# Patient Record
Sex: Male | Born: 1945 | Race: White | Hispanic: No | Marital: Married | Smoking: Current every day smoker
Health system: Southern US, Community
[De-identification: ages and names within clinical notes are randomized; demographics above are authoritative.]

## PROBLEM LIST (undated history)

## (undated) DIAGNOSIS — F419 Anxiety disorder, unspecified: Secondary | ICD-10-CM

## (undated) DIAGNOSIS — Z87442 Personal history of urinary calculi: Secondary | ICD-10-CM

## (undated) HISTORY — PX: APPENDECTOMY: SHX54

---

## 2009-07-15 ENCOUNTER — Ambulatory Visit (HOSPITAL_COMMUNITY): Admission: RE | Admit: 2009-07-15 | Discharge: 2009-07-15 | Payer: Self-pay | Admitting: *Deleted

## 2009-07-27 ENCOUNTER — Inpatient Hospital Stay (HOSPITAL_COMMUNITY): Admission: RE | Admit: 2009-07-27 | Discharge: 2009-07-28 | Payer: Self-pay | Admitting: *Deleted

## 2010-07-24 LAB — COMPREHENSIVE METABOLIC PANEL
ALT: 16 U/L (ref 0–53)
AST: 20 U/L (ref 0–37)
Albumin: 4.2 g/dL (ref 3.5–5.2)
BUN: 19 mg/dL (ref 6–23)
CO2: 26 mEq/L (ref 19–32)
CO2: 30 mEq/L (ref 19–32)
Chloride: 105 mEq/L (ref 96–112)
Chloride: 107 mEq/L (ref 96–112)
Creatinine, Ser: 1.22 mg/dL (ref 0.4–1.5)
GFR calc non Af Amer: >60 mL/min (ref >60)
Glucose, Bld: 104 mg/dL — ABNORMAL HIGH (ref 70–99)
Glucose, Bld: 107 mg/dL — ABNORMAL HIGH (ref 70–99)
Potassium: 3.9 mEq/L (ref 3.5–5.1)
Sodium: 141 mEq/L (ref 135–145)
Total Bilirubin: 0.6 mg/dL (ref 0.3–1.2)
Total Protein: 6.6 g/dL (ref 6.0–8.3)

## 2010-07-24 LAB — DIFFERENTIAL
Basophils Absolute: 0 10*3/uL (ref 0.0–0.1)
Basophils Absolute: 0 10*3/uL (ref 0.0–0.1)
Eosinophils Absolute: 0.2 10*3/uL (ref 0.0–0.7)
Eosinophils Absolute: 0.2 10*3/uL (ref 0.0–0.7)
Eosinophils Relative: 2 % (ref 0–5)
Monocytes Absolute: 0.3 10*3/uL (ref 0.1–1.0)
Monocytes Relative: 4 % (ref 3–12)
Neutrophils Relative %: 59 % (ref 43–77)

## 2010-07-24 LAB — URINALYSIS, ROUTINE W REFLEX MICROSCOPIC
Bilirubin Urine: NEGATIVE
Ketones, ur: NEGATIVE mg/dL
Nitrite: NEGATIVE
Protein, ur: NEGATIVE mg/dL
Urobilinogen, UA: 1 mg/dL (ref 0.0–1.0)
pH: 5 (ref 5.0–8.0)

## 2010-07-24 LAB — CBC
Hemoglobin: 17 g/dL (ref 13.0–17.0)
MCV: 96.9 fL (ref 78.0–100.0)
Platelets: 196 10*3/uL (ref 150–400)
Platelets: 239 10*3/uL (ref 150–400)
RBC: 4.84 MIL/uL (ref 4.22–5.81)
RBC: 5.1 MIL/uL (ref 4.22–5.81)
WBC: 10.6 10*3/uL — ABNORMAL HIGH (ref 4.0–10.5)

## 2010-07-24 LAB — URINE MICROSCOPIC-ADD ON

## 2010-07-24 LAB — PROTIME-INR
INR: 0.99 (ref 0.00–1.49)
Prothrombin Time: 12.7 seconds (ref 11.6–15.2)
Prothrombin Time: 13 seconds (ref 11.6–15.2)

## 2010-07-24 LAB — TYPE AND SCREEN: ABO/RH(D): O POS

## 2011-05-27 IMAGING — RF DG CERVICAL SPINE 2 OR 3 VIEWS
1 series · 2 of 2 positions shown · non-contrast
Comparison: None.

CLINICAL DATA: Neck pain with radiculopathy

CERVICAL SPINE - 2-3 VIEW intraoperative C-arm.

[Series 1: run · 2 of 2 slices shown]
[im 1/2]
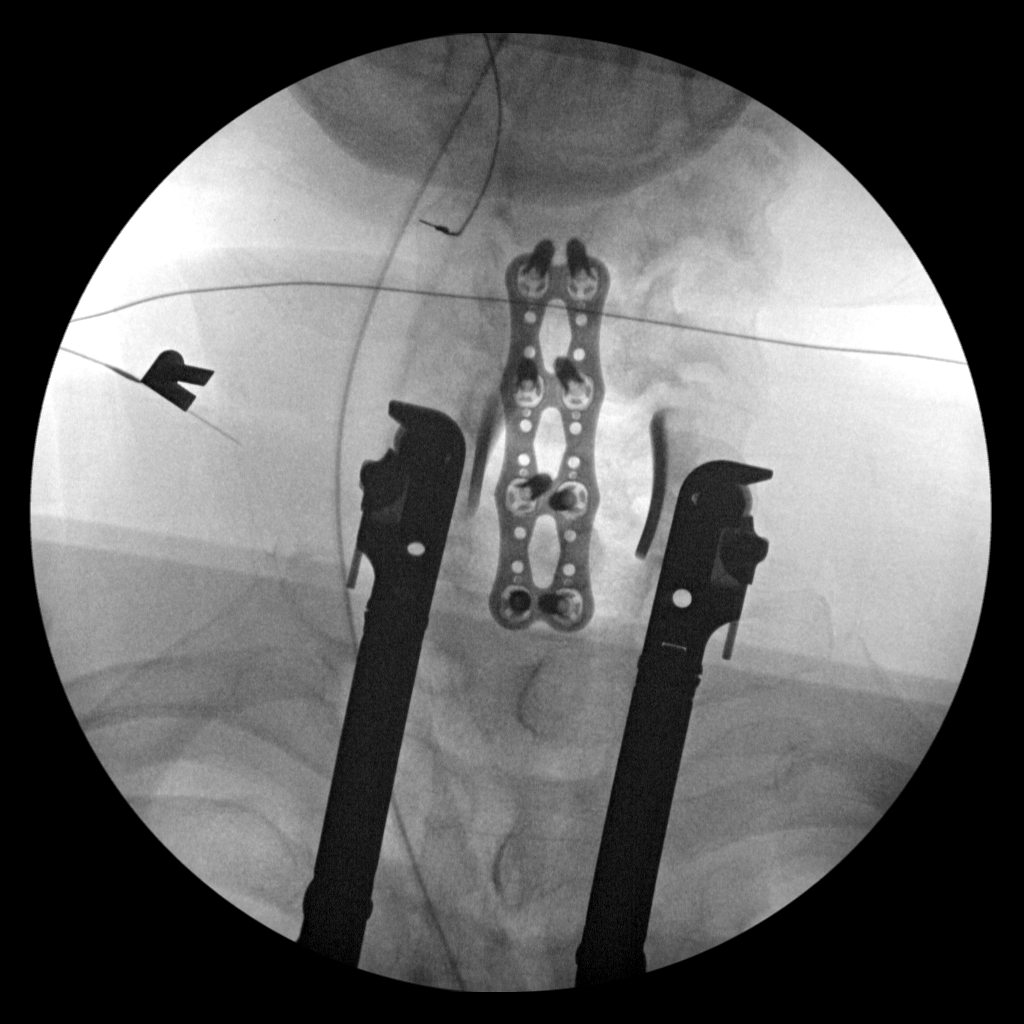
[im 2/2]
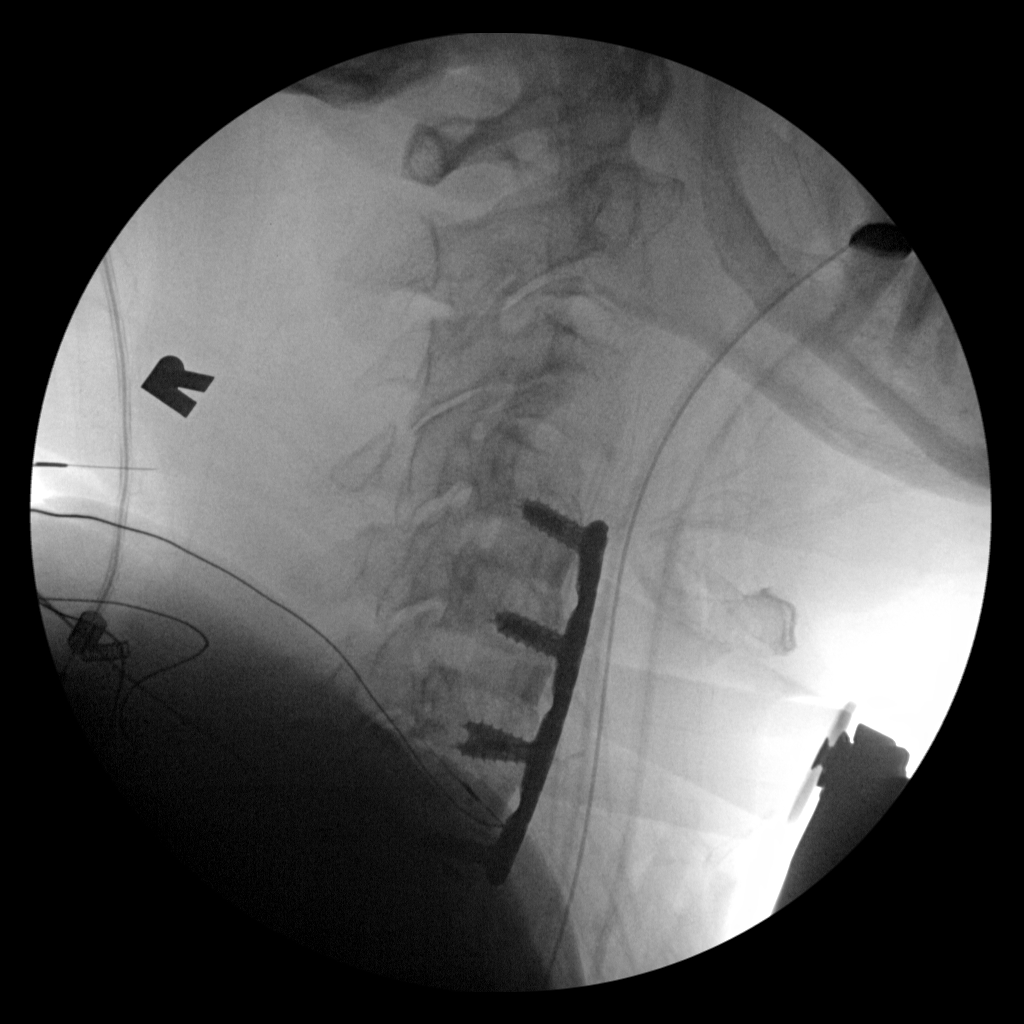

[2 of 2 positions shown; findings below may reference images not displayed]

FINDINGS: There has been an  ACDF from C4-C7.  Grossly satisfactory
position and alignment.  Recommend repeat films postoperatively to
assess placement of hardware and bone plugs.
IMPRESSION: As above.

## 2018-04-07 ENCOUNTER — Other Ambulatory Visit: Payer: Self-pay | Admitting: Orthopedic Surgery

## 2018-04-25 NOTE — Pre-Procedure Instructions (Signed)
John Ellison  04/25/2018      [facility] - [facility] - [place], [place] - [address] [place] [address] Phone: [phone] Fax: [phone]    Your procedure is scheduled on Thursday January 9th.  [facility] Admitting at 9:00 A.M.  Call this number if you have problems the morning of surgery:  [phone]   Remember:  Do not eat or drink after midnight.     Take these medicines the morning of surgery with A SIP OF WATER  clonazePAM (KLONOPIN) if needed methocarbamol (ROBAXIN) if needed traMADol (ULTRAM) if needed   7 days prior to surgery STOP taking any Aspirin(unless otherwise instructed by your surgeon), Alka-seltzer, Aleve, Naproxen, Ibuprofen, Motrin, Advil, Goody's, BC's, all herbal medications, fish oil, and all vitamins    Do not wear jewelry.  Do not wear lotions, powders, or colognes, or deodorant.  Do not shave 48 hours prior to surgery.  Men may shave face and neck.  Do not bring valuables to the hospital.  [facility] is not responsible for any belongings or valuables.  Contacts, eyeglasses, hearing aids, dentures or bridgework may not be worn into surgery.  Leave your suitcase in the car.  After surgery it may be brought to your room.  For patients admitted to the hospital, discharge time will be determined by your treatment team.  Patients discharged the day of surgery will not be allowed to drive home.   [facility]- Preparing For Surgery  Before surgery, you can play an important role. Because skin is not sterile, your skin needs to be as free of germs as possible. You can reduce the number of germs on your skin by washing with CHG (chlorahexidine gluconate) Soap before surgery.  CHG is an antiseptic cleaner which kills germs and bonds with the skin to continue killing germs even after washing.    Oral Hygiene is also important to reduce your risk of infection.  Remember - BRUSH YOUR TEETH THE  MORNING OF SURGERY WITH YOUR REGULAR TOOTHPASTE  Please do not use if you have an allergy to CHG or antibacterial soaps. If your skin becomes reddened/irritated stop using the CHG.  Do not shave (including legs and underarms) for at least 48 hours prior to first CHG shower. It is OK to shave your face.  Please follow these instructions carefully.   1. Shower the NIGHT BEFORE SURGERY and the MORNING OF SURGERY with CHG.   2. If you chose to wash your hair, wash your hair first as usual with your normal shampoo.  3. After you shampoo, rinse your hair and body thoroughly to remove the shampoo.  4. Use CHG as you would any other liquid soap. You can apply CHG directly to the skin and wash gently with a scrungie or a clean washcloth.   5. Apply the CHG Soap to your body ONLY FROM THE NECK DOWN.  Do not use on open wounds or open sores. Avoid contact with your eyes, ears, mouth and genitals (private parts). Wash Face and genitals (private parts)  with your normal soap.  6. Wash thoroughly, paying special attention to the area where your surgery will be performed.  7. Thoroughly rinse your body with warm water from the neck down.  8. DO NOT shower/wash with your normal soap after using and rinsing off the CHG Soap.  9. Pat yourself dry with a CLEAN TOWEL.  10. Wear CLEAN PAJAMAS to bed the  night before surgery, wear comfortable clothes the morning of surgery  11. Place CLEAN SHEETS on your bed the night of your first shower and DO NOT SLEEP WITH PETS.    Day of Surgery: Shower as stated above. Do not apply any deodorants/lotions.  Please wear clean clothes to the hospital/surgery center.   Remember to brush your teeth WITH YOUR REGULAR TOOTHPASTE.   Please read over the following fact sheets that you were given.

## 2018-04-28 ENCOUNTER — Other Ambulatory Visit: Payer: Self-pay

## 2018-04-28 ENCOUNTER — Ambulatory Visit (HOSPITAL_COMMUNITY)
Admission: RE | Admit: 2018-04-28 | Discharge: 2018-04-28 | Disposition: A | Discharge status: Home / Self Care | Payer: Worker's Compensation | Source: Ambulatory Visit | Attending: Orthopedic Surgery | Admitting: Orthopedic Surgery

## 2018-04-28 ENCOUNTER — Encounter (HOSPITAL_COMMUNITY): Payer: Self-pay

## 2018-04-28 ENCOUNTER — Encounter (HOSPITAL_COMMUNITY)
Admission: RE | Admit: 2018-04-28 | Discharge: 2018-04-28 | Disposition: A | Discharge status: Home / Self Care | Payer: Worker's Compensation | Source: Ambulatory Visit | Attending: Orthopedic Surgery | Admitting: Orthopedic Surgery

## 2018-04-28 DIAGNOSIS — Z01811 Encounter for preprocedural respiratory examination: Secondary | ICD-10-CM | POA: Diagnosis present

## 2018-04-28 HISTORY — DX: Personal history of urinary calculi: Z87.442

## 2018-04-28 HISTORY — DX: Anxiety disorder, unspecified: F41.9

## 2018-04-28 LAB — CBC WITH DIFFERENTIAL/PLATELET
Abs Immature Granulocytes: 0.02 10*3/uL (ref 0.00–0.07)
Basophils Absolute: 0 10*3/uL (ref 0.0–0.1)
Basophils Relative: 1 %
Eosinophils Absolute: 0.1 10*3/uL (ref 0.0–0.5)
Eosinophils Relative: 1 %
HEMATOCRIT: 51.9 % (ref 39.0–52.0)
HEMOGLOBIN: 16.7 g/dL (ref 13.0–17.0)
Immature Granulocytes: 0 %
LYMPHS ABS: 1.8 10*3/uL (ref 0.7–4.0)
Lymphocytes Relative: 25 %
MCH: 30.9 pg (ref 26.0–34.0)
MCHC: 32.2 g/dL (ref 30.0–36.0)
MCV: 96.1 fL (ref 80.0–100.0)
MONOS PCT: 6 %
Monocytes Absolute: 0.5 10*3/uL (ref 0.1–1.0)
Neutro Abs: 4.8 10*3/uL (ref 1.7–7.7)
Neutrophils Relative %: 67 %
Platelets: 212 10*3/uL (ref 150–400)
RBC: 5.4 MIL/uL (ref 4.22–5.81)
RDW: 12.8 % (ref 11.5–15.5)
WBC: 7.2 10*3/uL (ref 4.0–10.5)
nRBC: 0 % (ref 0.0–0.2)

## 2018-04-28 LAB — URINALYSIS, ROUTINE W REFLEX MICROSCOPIC
Bilirubin Urine: NEGATIVE
Glucose, UA: NEGATIVE mg/dL
Hgb urine dipstick: NEGATIVE
Ketones, ur: NEGATIVE mg/dL
LEUKOCYTES UA: NEGATIVE
Nitrite: NEGATIVE
Protein, ur: NEGATIVE mg/dL
Specific Gravity, Urine: 1.02 (ref 1.005–1.030)
pH: 5 (ref 5.0–8.0)

## 2018-04-28 LAB — COMPREHENSIVE METABOLIC PANEL
ALK PHOS: 55 U/L (ref 38–126)
ALT: 21 U/L (ref 0–44)
AST: 22 U/L (ref 15–41)
Albumin: 4 g/dL (ref 3.5–5.0)
Anion gap: 11 (ref 5–15)
BUN: 22 mg/dL (ref 8–23)
CALCIUM: 9.1 mg/dL (ref 8.9–10.3)
CO2: 22 mmol/L (ref 22–32)
Chloride: 108 mmol/L (ref 98–111)
Creatinine, Ser: 1.39 mg/dL — ABNORMAL HIGH (ref 0.61–1.24)
GFR calc Af Amer: 59 mL/min — ABNORMAL LOW (ref >60)
GFR calc non Af Amer: 51 mL/min — ABNORMAL LOW (ref >60)
Glucose, Bld: 118 mg/dL — ABNORMAL HIGH (ref 70–99)
Potassium: 3.6 mmol/L (ref 3.5–5.1)
Sodium: 141 mmol/L (ref 135–145)
Total Bilirubin: 0.6 mg/dL (ref 0.3–1.2)
Total Protein: 6.8 g/dL (ref 6.5–8.1)

## 2018-04-28 LAB — PROTIME-INR
INR: 1.06
Prothrombin Time: 13.7 seconds (ref 11.4–15.2)

## 2018-04-28 LAB — SURGICAL PCR SCREEN
MRSA, PCR: NEGATIVE
Staphylococcus aureus: NEGATIVE

## 2018-04-28 LAB — TYPE AND SCREEN
ABO/RH(D): O POS
Antibody Screen: NEGATIVE

## 2018-04-28 LAB — APTT: aPTT: 35 seconds (ref 24–36)

## 2018-04-28 NOTE — Progress Notes (Signed)
PCP - Dr. Noland FordyceStephen Erlandson Cardiologist - denies  Chest x-ray - 04/28/18 EKG - 04/28/18 Stress Test - 10+ years ago ECHO - denies Cardiac Cath - denies  Sleep Study - denies  Aspirin Instructions: N/A; instructed not to take any alka-seltzer 7 days prior to surgery date.   Anesthesia review: No  Patient denies shortness of breath, fever, cough and chest pain at PAT appointment   Patient verbalized understanding of instructions that were given to them at the PAT appointment. Patient was also instructed that they will need to review over the PAT instructions again at home before surgery.

## 2018-05-07 MED ORDER — TRANEXAMIC ACID-NACL 1000-0.7 MG/100ML-% IV SOLN
1000.0000 mg | INTRAVENOUS | Status: AC
  Administered 2018-05-08: 1000 mg via INTRAVENOUS

## 2018-05-08 ENCOUNTER — Encounter (HOSPITAL_COMMUNITY): Payer: Self-pay

## 2018-05-08 ENCOUNTER — Inpatient Hospital Stay (HOSPITAL_COMMUNITY)
Admission: RE | Admit: 2018-05-08 | Discharge: 2018-05-09 | DRG: 483 | Disposition: A | Discharge status: Home / Self Care | Payer: Worker's Compensation | Attending: Orthopedic Surgery | Admitting: Orthopedic Surgery

## 2018-05-08 ENCOUNTER — Inpatient Hospital Stay (HOSPITAL_COMMUNITY): Payer: Worker's Compensation | Admitting: Certified Registered Nurse Anesthetist

## 2018-05-08 ENCOUNTER — Encounter (HOSPITAL_COMMUNITY)
Admission: RE | Disposition: A | Discharge status: Home / Self Care | Payer: Self-pay | Source: Home / Self Care | Attending: Orthopedic Surgery

## 2018-05-08 ENCOUNTER — Inpatient Hospital Stay (HOSPITAL_COMMUNITY): Payer: Worker's Compensation

## 2018-05-08 ENCOUNTER — Other Ambulatory Visit: Payer: Self-pay

## 2018-05-08 DIAGNOSIS — M75101 Unspecified rotator cuff tear or rupture of right shoulder, not specified as traumatic: Secondary | ICD-10-CM | POA: Diagnosis present

## 2018-05-08 DIAGNOSIS — Z79899 Other long term (current) drug therapy: Secondary | ICD-10-CM

## 2018-05-08 DIAGNOSIS — F419 Anxiety disorder, unspecified: Secondary | ICD-10-CM | POA: Diagnosis present

## 2018-05-08 DIAGNOSIS — M19011 Primary osteoarthritis, right shoulder: Secondary | ICD-10-CM | POA: Diagnosis present

## 2018-05-08 DIAGNOSIS — Z96611 Presence of right artificial shoulder joint: Secondary | ICD-10-CM

## 2018-05-08 DIAGNOSIS — F17210 Nicotine dependence, cigarettes, uncomplicated: Secondary | ICD-10-CM | POA: Diagnosis present

## 2018-05-08 DIAGNOSIS — Z9889 Other specified postprocedural states: Secondary | ICD-10-CM

## 2018-05-08 HISTORY — PX: REVERSE SHOULDER ARTHROPLASTY: SHX5054

## 2018-05-08 SURGERY — ARTHROPLASTY, SHOULDER, TOTAL, REVERSE
Anesthesia: General | Laterality: Right

## 2018-05-08 MED ORDER — MIDAZOLAM HCL 2 MG/2ML IJ SOLN
INTRAMUSCULAR | Status: AC
  Administered 2018-05-08: 2 mg via INTRAVENOUS
  Filled 2018-05-08: qty 2

## 2018-05-08 MED ORDER — MENTHOL 3 MG MT LOZG: 1.0000 | LOZENGE | OROMUCOSAL | Status: DC | PRN

## 2018-05-08 MED ORDER — METOCLOPRAMIDE HCL 5 MG PO TABS: 5.0000 mg | ORAL_TABLET | Freq: Three times a day (TID) | ORAL | Status: DC | PRN

## 2018-05-08 MED ORDER — OXYCODONE HCL 5 MG/5ML PO SOLN: 5.0000 mg | Freq: Once | ORAL | Status: DC | PRN

## 2018-05-08 MED ORDER — ONDANSETRON HCL 4 MG/2ML IJ SOLN: 4.0000 mg | Freq: Four times a day (QID) | INTRAMUSCULAR | Status: DC | PRN

## 2018-05-08 MED ORDER — ASPIRIN EC 325 MG PO TBEC
325.0000 mg | DELAYED_RELEASE_TABLET | Freq: Every day | ORAL | Status: DC
  Administered 2018-05-09: 325 mg via ORAL
  Filled 2018-05-08: qty 1

## 2018-05-08 MED ORDER — ASPIRIN 325 MG PO TBEC: 325.0000 mg | DELAYED_RELEASE_TABLET | Freq: Every day | ORAL | 0 refills | Status: AC

## 2018-05-08 MED ORDER — OXYCODONE HCL 5 MG PO TABS: 5.0000 mg | ORAL_TABLET | ORAL | Status: DC | PRN

## 2018-05-08 MED ORDER — CHLORHEXIDINE GLUCONATE 4 % EX LIQD: 60.0000 mL | Freq: Once | CUTANEOUS | Status: DC

## 2018-05-08 MED ORDER — SUGAMMADEX SODIUM 200 MG/2ML IV SOLN
INTRAVENOUS | Status: DC | PRN
  Administered 2018-05-08: 100 mg via INTRAVENOUS

## 2018-05-08 MED ORDER — ROCURONIUM BROMIDE 10 MG/ML (PF) SYRINGE
PREFILLED_SYRINGE | INTRAVENOUS | Status: DC | PRN
  Administered 2018-05-08: 50 mg via INTRAVENOUS

## 2018-05-08 MED ORDER — TRANEXAMIC ACID-NACL 1000-0.7 MG/100ML-% IV SOLN
INTRAVENOUS | Status: AC
  Filled 2018-05-08: qty 100

## 2018-05-08 MED ORDER — FENTANYL CITRATE (PF) 100 MCG/2ML IJ SOLN
INTRAMUSCULAR | Status: AC
  Administered 2018-05-08: 50 ug via INTRAVENOUS
  Filled 2018-05-08: qty 2

## 2018-05-08 MED ORDER — CLONAZEPAM 1 MG PO TABS
2.0000 mg | ORAL_TABLET | Freq: Two times a day (BID) | ORAL | Status: DC
  Administered 2018-05-08: 2 mg via ORAL
  Filled 2018-05-08 (×2): qty 2

## 2018-05-08 MED ORDER — PROPOFOL 10 MG/ML IV BOLUS
INTRAVENOUS | Status: DC | PRN
Start: 2018-05-08 — End: 2018-05-08
  Administered 2018-05-08: 200 mg via INTRAVENOUS

## 2018-05-08 MED ORDER — OXYCODONE HCL 5 MG PO TABS: 10.0000 mg | ORAL_TABLET | ORAL | Status: DC | PRN

## 2018-05-08 MED ORDER — METHOCARBAMOL 500 MG PO TABS: 500.0000 mg | ORAL_TABLET | Freq: Four times a day (QID) | ORAL | Status: DC | PRN

## 2018-05-08 MED ORDER — FENTANYL CITRATE (PF) 100 MCG/2ML IJ SOLN
50.0000 ug | Freq: Once | INTRAMUSCULAR | Status: AC
  Administered 2018-05-08: 50 ug via INTRAVENOUS

## 2018-05-08 MED ORDER — LACTATED RINGERS IV SOLN
INTRAVENOUS | Status: DC
  Administered 2018-05-08: 10:00:00 via INTRAVENOUS

## 2018-05-08 MED ORDER — CEFAZOLIN SODIUM-DEXTROSE 1-4 GM/50ML-% IV SOLN
1.0000 g | Freq: Four times a day (QID) | INTRAVENOUS | Status: AC
  Administered 2018-05-08 – 2018-05-09 (×3): 1 g via INTRAVENOUS
  Filled 2018-05-08 (×3): qty 50

## 2018-05-08 MED ORDER — SODIUM CHLORIDE 0.9 % IV SOLN
INTRAVENOUS | Status: DC
  Administered 2018-05-08: 14:00:00 via INTRAVENOUS

## 2018-05-08 MED ORDER — DEXAMETHASONE SODIUM PHOSPHATE 10 MG/ML IJ SOLN
INTRAMUSCULAR | Status: DC | PRN
  Administered 2018-05-08: 10 mg via INTRAVENOUS

## 2018-05-08 MED ORDER — DOCUSATE SODIUM 100 MG PO CAPS
100.0000 mg | ORAL_CAPSULE | Freq: Two times a day (BID) | ORAL | Status: DC
  Administered 2018-05-08 (×2): 100 mg via ORAL
  Filled 2018-05-08 (×3): qty 1

## 2018-05-08 MED ORDER — FENTANYL CITRATE (PF) 100 MCG/2ML IJ SOLN: 25.0000 ug | INTRAMUSCULAR | Status: DC | PRN

## 2018-05-08 MED ORDER — ACETAMINOPHEN 325 MG PO TABS: 325.0000 mg | ORAL_TABLET | Freq: Four times a day (QID) | ORAL | Status: DC | PRN

## 2018-05-08 MED ORDER — PHENOL 1.4 % MT LIQD: 1.0000 | OROMUCOSAL | Status: DC | PRN

## 2018-05-08 MED ORDER — ONDANSETRON HCL 4 MG/2ML IJ SOLN
INTRAMUSCULAR | Status: DC | PRN
Start: 2018-05-08 — End: 2018-05-08
  Administered 2018-05-08: 4 mg via INTRAVENOUS

## 2018-05-08 MED ORDER — ONDANSETRON HCL 4 MG PO TABS: 4.0000 mg | ORAL_TABLET | Freq: Four times a day (QID) | ORAL | Status: DC | PRN

## 2018-05-08 MED ORDER — METHOCARBAMOL 1000 MG/10ML IJ SOLN
500.0000 mg | Freq: Four times a day (QID) | INTRAVENOUS | Status: DC | PRN
  Filled 2018-05-08: qty 5

## 2018-05-08 MED ORDER — OXYCODONE HCL 5 MG PO TABS: 5.0000 mg | ORAL_TABLET | Freq: Once | ORAL | Status: DC | PRN

## 2018-05-08 MED ORDER — SODIUM CHLORIDE 0.9 % IV SOLN
INTRAVENOUS | Status: DC | PRN
  Administered 2018-05-08: 45 ug/min via INTRAVENOUS

## 2018-05-08 MED ORDER — PROPOFOL 10 MG/ML IV BOLUS
INTRAVENOUS | Status: AC
  Filled 2018-05-08: qty 20

## 2018-05-08 MED ORDER — SODIUM CHLORIDE 0.9 % IR SOLN
Status: DC | PRN
  Administered 2018-05-08: 3000 mL

## 2018-05-08 MED ORDER — 0.9 % SODIUM CHLORIDE (POUR BTL) OPTIME
TOPICAL | Status: DC | PRN
  Administered 2018-05-08: 1000 mL

## 2018-05-08 MED ORDER — LIDOCAINE 2% (20 MG/ML) 5 ML SYRINGE
INTRAMUSCULAR | Status: DC | PRN
  Administered 2018-05-08: 100 mg via INTRAVENOUS

## 2018-05-08 MED ORDER — MIDAZOLAM HCL 2 MG/2ML IJ SOLN: 2.0000 mg | Freq: Once | INTRAMUSCULAR | Status: DC

## 2018-05-08 MED ORDER — METOCLOPRAMIDE HCL 5 MG/ML IJ SOLN: 5.0000 mg | Freq: Three times a day (TID) | INTRAMUSCULAR | Status: DC | PRN

## 2018-05-08 MED ORDER — BISACODYL 5 MG PO TBEC: 5.0000 mg | DELAYED_RELEASE_TABLET | Freq: Every day | ORAL | Status: DC | PRN

## 2018-05-08 MED ORDER — ONDANSETRON HCL 4 MG/2ML IJ SOLN
4.0000 mg | Freq: Once | INTRAMUSCULAR | Status: DC | PRN
Start: 2018-05-08 — End: 2018-05-08

## 2018-05-08 MED ORDER — OXYCODONE-ACETAMINOPHEN 5-325 MG PO TABS: 1.0000 | ORAL_TABLET | Freq: Four times a day (QID) | ORAL | 0 refills | Status: AC | PRN

## 2018-05-08 MED ORDER — FENTANYL CITRATE (PF) 250 MCG/5ML IJ SOLN
INTRAMUSCULAR | Status: AC
  Filled 2018-05-08: qty 5

## 2018-05-08 MED ORDER — GLYCOPYRROLATE 0.2 MG/ML IJ SOLN
INTRAMUSCULAR | Status: DC | PRN
  Administered 2018-05-08: .1 mg via INTRAVENOUS
  Administered 2018-05-08: 0.1 mg via INTRAVENOUS

## 2018-05-08 MED ORDER — HYDROMORPHONE HCL 1 MG/ML IJ SOLN
0.5000 mg | INTRAMUSCULAR | Status: DC | PRN
  Filled 2018-05-08: qty 1

## 2018-05-08 MED ORDER — ACETAMINOPHEN 500 MG PO TABS
1000.0000 mg | ORAL_TABLET | Freq: Four times a day (QID) | ORAL | Status: AC
  Administered 2018-05-08 – 2018-05-09 (×4): 1000 mg via ORAL
  Filled 2018-05-08 (×4): qty 2

## 2018-05-08 MED ORDER — DIPHENHYDRAMINE HCL 12.5 MG/5ML PO ELIX: 12.5000 mg | ORAL_SOLUTION | ORAL | Status: DC | PRN

## 2018-05-08 MED ORDER — CEFAZOLIN SODIUM-DEXTROSE 2-4 GM/100ML-% IV SOLN
2.0000 g | INTRAVENOUS | Status: AC
  Administered 2018-05-08: 2 g via INTRAVENOUS
  Filled 2018-05-08: qty 100

## 2018-05-08 MED ORDER — ALUM & MAG HYDROXIDE-SIMETH 200-200-20 MG/5ML PO SUSP: 30.0000 mL | ORAL | Status: DC | PRN

## 2018-05-08 MED ORDER — BUPIVACAINE LIPOSOME 1.3 % IJ SUSP
INTRAMUSCULAR | Status: DC | PRN
  Administered 2018-05-08: 10 mL

## 2018-05-08 MED ORDER — BUPIVACAINE HCL (PF) 0.5 % IJ SOLN
INTRAMUSCULAR | Status: DC | PRN
Start: 2018-05-08 — End: 2018-05-08
  Administered 2018-05-08: 15 mL via PERINEURAL

## 2018-05-08 MED ORDER — SENNOSIDES-DOCUSATE SODIUM 8.6-50 MG PO TABS: 1.0000 | ORAL_TABLET | Freq: Every evening | ORAL | Status: DC | PRN

## 2018-05-08 SURGICAL SUPPLY — 73 items
BASEPLATE P2 COATD GLND 6.5X30 (Shoulder) IMPLANT
BIT DRILL 2.5 DIA 127 CALI (BIT) ×2 IMPLANT
BIT DRILL 4 DIA CALIBRATED (BIT) ×2 IMPLANT
BIT DRILL 5/64X5 DISP (BIT) ×2 IMPLANT
BLADE SAW SAG 73X25 THK (BLADE) ×2
BLADE SAW SGTL 73X25 THK (BLADE) ×1 IMPLANT
BLADE SURG 15 STRL LF DISP TIS (BLADE) IMPLANT
BLADE SURG 15 STRL SS (BLADE)
BOWL SMART MIX CTS (DISPOSABLE) IMPLANT
CHLORAPREP W/TINT 26ML (MISCELLANEOUS) ×3 IMPLANT
CLOSURE WOUND 1/2 X4 (GAUZE/BANDAGES/DRESSINGS) ×1
COVER SURGICAL LIGHT HANDLE (MISCELLANEOUS) ×3 IMPLANT
COVER WAND RF STERILE (DRAPES) ×3 IMPLANT
DRAPE INCISE IOBAN 66X45 STRL (DRAPES) ×3 IMPLANT
DRAPE ORTHO SPLIT 77X108 STRL (DRAPES) ×4
DRAPE SURG ORHT 6 SPLT 77X108 (DRAPES) ×2 IMPLANT
DRSG AQUACEL AG ADV 3.5X 6 (GAUZE/BANDAGES/DRESSINGS) ×3 IMPLANT
ELECT BLADE 4.0 EZ CLEAN MEGAD (MISCELLANEOUS) ×3
ELECT REM PT RETURN 9FT ADLT (ELECTROSURGICAL) ×3
ELECTRODE BLDE 4.0 EZ CLN MEGD (MISCELLANEOUS) ×1 IMPLANT
ELECTRODE REM PT RTRN 9FT ADLT (ELECTROSURGICAL) ×1 IMPLANT
GLOVE BIO SURGEON STRL SZ7 (GLOVE) ×3 IMPLANT
GLOVE BIO SURGEON STRL SZ7.5 (GLOVE) ×3 IMPLANT
GLOVE BIOGEL PI IND STRL 7.0 (GLOVE) ×1 IMPLANT
GLOVE BIOGEL PI IND STRL 8 (GLOVE) ×1 IMPLANT
GLOVE BIOGEL PI INDICATOR 7.0 (GLOVE) ×2
GLOVE BIOGEL PI INDICATOR 8 (GLOVE) ×2
GOWN STRL REUS W/ TWL LRG LVL3 (GOWN DISPOSABLE) ×3 IMPLANT
GOWN STRL REUS W/ TWL XL LVL3 (GOWN DISPOSABLE) ×1 IMPLANT
GOWN STRL REUS W/TWL LRG LVL3 (GOWN DISPOSABLE) ×6
GOWN STRL REUS W/TWL XL LVL3 (GOWN DISPOSABLE) ×2
HANDPIECE INTERPULSE COAX TIP (DISPOSABLE) ×2
HOOD PEEL AWAY FLYTE STAYCOOL (MISCELLANEOUS) ×6 IMPLANT
INSERT EPOLY STND HUMERUS 32MM (Shoulder) ×3 IMPLANT
INSERT EPOLYSTD HUMERUS 32MM (Shoulder) IMPLANT
KIT BASIN OR (CUSTOM PROCEDURE TRAY) ×3 IMPLANT
KIT TURNOVER KIT B (KITS) ×3 IMPLANT
MANIFOLD NEPTUNE II (INSTRUMENTS) ×3 IMPLANT
NDL MAYO TROCAR (NEEDLE) IMPLANT
NEEDLE MAYO TROCAR (NEEDLE) ×3 IMPLANT
NS IRRIG 1000ML POUR BTL (IV SOLUTION) ×3 IMPLANT
P2 COATDE GLNOID BSEPLT 6.5X30 (Shoulder) ×3 IMPLANT
PACK SHOULDER (CUSTOM PROCEDURE TRAY) ×3 IMPLANT
PAD ARMBOARD 7.5X6 YLW CONV (MISCELLANEOUS) ×5 IMPLANT
RESTRAINT HEAD UNIVERSAL NS (MISCELLANEOUS) ×3 IMPLANT
SCREW BONE LOCKING RSP 5.0X34 (Screw) ×6 IMPLANT
SCREW BONE RSP LOCK 5X18 (Screw) IMPLANT
SCREW BONE RSP LOCK 5X22 (Screw) IMPLANT
SCREW BONE RSP LOCK 5X34 (Screw) IMPLANT
SCREW BONE RSP LOCKING 18MM LG (Screw) ×3 IMPLANT
SCREW BONE RSP LOCKING 5.0X32 (Screw) ×3 IMPLANT
SCREW RETAIN W/HEAD 32MM (Shoulder) ×2 IMPLANT
SET HNDPC FAN SPRY TIP SCT (DISPOSABLE) ×1 IMPLANT
SLING ARM FOAM STRAP LRG (SOFTGOODS) ×3 IMPLANT
SLING ARM FOAM STRAP MED (SOFTGOODS) IMPLANT
SPONGE LAP 18X18 X RAY DECT (DISPOSABLE) IMPLANT
SPONGE LAP 4X18 RFD (DISPOSABLE) IMPLANT
STEM REVERSE ALTIVATE 8MM STD (Shoulder) ×2 IMPLANT
STRIP CLOSURE SKIN 1/2X4 (GAUZE/BANDAGES/DRESSINGS) ×2 IMPLANT
SUCTION FRAZIER HANDLE 10FR (MISCELLANEOUS) ×2
SUCTION TUBE FRAZIER 10FR DISP (MISCELLANEOUS) ×1 IMPLANT
SUPPORT WRAP ARM LG (MISCELLANEOUS) ×3 IMPLANT
SUT ETHIBOND NAB CT1 #1 30IN (SUTURE) ×3 IMPLANT
SUT FIBERWIRE #2 38 T-5 BLUE (SUTURE) ×3
SUT MNCRL AB 4-0 PS2 18 (SUTURE) ×3 IMPLANT
SUT SILK 2 0 TIES 17X18 (SUTURE)
SUT SILK 2-0 18XBRD TIE BLK (SUTURE) IMPLANT
SUT VIC AB 2-0 CT1 27 (SUTURE) ×4
SUT VIC AB 2-0 CT1 TAPERPNT 27 (SUTURE) ×2 IMPLANT
SUTURE FIBERWR #2 38 T-5 BLUE (SUTURE) IMPLANT
TOWEL OR 17X26 10 PK STRL BLUE (TOWEL DISPOSABLE) ×3 IMPLANT
WATER STERILE IRR 1000ML POUR (IV SOLUTION) ×3 IMPLANT
YANKAUER SUCT BULB TIP NO VENT (SUCTIONS) IMPLANT

## 2018-05-08 NOTE — Op Note (Signed)
Procedure(s): REVERSE SHOULDER ARTHROPLASTY Procedure Note  John Ellison male 73 y.o. 05/08/2018  Preoperative diagnosis: Right shoulder rotator cuff tear arthropathy  Postoperative diagnosis: Same  Procedure(s) and Anesthesia Type: Right shoulder REVERSE SHOULDER ARTHROPLASTY - General   Indications:  73 y.o. 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 Pain and dysfunction interfered with quality of life and nonoperative treatment with activity modification, NSAIDS and injections failed.     Surgeon: Berline Lopes   Assistants: April Green, RNFA  Anesthesia: General endotracheal anesthesia with preoperative interscalene block given by attending anesthesiologist    Procedure Detail  REVERSE SHOULDER ARTHROPLASTY   Estimated Blood Loss:  200 mL         Drains: none  Blood Given: none          Specimens: none        Complications:  * No complications entered in OR log *         Disposition: PACU - hemodynamically stable.         Condition: stable      OPERATIVE FINDINGS:  A DJO Altivate pressfit reverse total shoulder arthroplasty was placed with a  size 8 stem, a 32 standard glenosphere, and a standard-mm poly insert. The base plate  fixation was excellent.  PROCEDURE: The patient was identified in the preoperative holding area  where I personally marked the operative site after verifying site, side,  and procedure with the patient. An interscalene block given by  the attending anesthesiologist in the holding area and the patient was taken back to the operating room where all extremities were  carefully padded in position after general anesthesia was induced. She  was placed in a beach-chair position and the operative upper extremity was  prepped and draped in a standard sterile fashion. The patient received 1 g IV tranexamic acid at the start of the case around time of the incision. An approximately 10-  cm  incision was made from the tip of the coracoid process to the center  point of the humerus at the level of the axilla. Dissection was carried  down through subcutaneous tissues to the level of the cephalic vein  which was taken laterally with the deltoid. The pectoralis major was  retracted medially. The subdeltoid space was developed and the lateral  edge of the conjoined tendon was identified. The undersurface of  conjoined tendon was palpated and the musculocutaneous nerve was not in  the field. Retractor was placed underneath the conjoined and second  retractor was placed lateral into the deltoid. The circumflex humeral  artery and vessels were identified and clamped and coagulated. The  biceps tendon was absent.  The subscapularis was taken down as a peel with the underlying capsule.  The  joint was then gently externally rotated while the capsule was released  from the humeral neck around to just beyond the 6 o'clock position. At  this point, the joint was dislocated and the humeral head was presented  into the wound. The excessive osteophyte formation was removed with a  large rongeur.  The cutting guide was used to make the appropriate  head cut and the head was saved for potentially bone grafting.  The glenoid was exposed with the arm in an  abducted extended position. The anterior and posterior labrum were  completely excised and the capsule was released circumferentially to  allow for exposure of the glenoid for preparation. The 2.5 mm drill was  placed using the  guide in 5-10 inferior angulation and the tap was then advanced in the same hole. Small and large reamers were then used. The tap was then removed and the Metaglene was then screwed in with excellent purchase.  The peripheral guide was then used to drilled measured and filled peripheral locking screws. The size 32-4 glenosphere was then impacted on the [facility] taper and the central screw was placed. The humerus was then again  exposed and the diaphyseal reamers were used followed by the metaphyseal reamers. The final broach was left in place in the proximal trial was placed. The joint was reduced and with this implant it was felt that soft tissue tensioning was appropriate with excellent stability and excellent range of motion. Therefore, final humeral stem was placed press-fit with bone grafting.  And then the trial polyethylene inserts were tested again and the above implant was felt to be the most appropriate for final insertion. The joint was reduced taken through full range of motion and felt to be stable. Soft tissue tension was appropriate.  The joint was then copiously irrigated with pulse  lavage and the wound was then closed. The subscapularis was repaired with labral tapes placed around the implant through bone tunnels.  Skin was closed with 2-0 Vicryl in a deep dermal layer and 4-0  Monocryl for skin closure. Steri-Strips were applied. Sterile  dressings were then applied as well as a sling. The patient was allowed  to awaken from general anesthesia, transferred to stretcher, and taken  to recovery room in stable condition.   POSTOPERATIVE PLAN: The patient will be kept in the hospital postoperatively  for pain control and therapy.

## 2018-05-08 NOTE — Anesthesia Procedure Notes (Signed)
Procedure Name: Intubation Date/Time: 05/08/2018 10:24 AM Performed by: Julieta Bellini, CRNA Pre-anesthesia Checklist: Patient identified, Emergency Drugs available, Suction available and Patient being monitored Patient Re-evaluated:Patient Re-evaluated prior to induction Oxygen Delivery Method: Circle system utilized Preoxygenation: Pre-oxygenation with 100% oxygen Induction Type: IV induction Ventilation: Mask ventilation without difficulty Laryngoscope Size: Mac and 4 Grade View: Grade I Tube type: Oral Tube size: 7.5 mm Number of attempts: 1 Airway Equipment and Method: Stylet Placement Confirmation: ETT inserted through vocal cords under direct vision,  positive ETCO2 and breath sounds checked- equal and bilateral Secured at: 22 cm Tube secured with: Tape Dental Injury: Teeth and Oropharynx as per pre-operative assessment

## 2018-05-08 NOTE — Anesthesia Procedure Notes (Signed)
Anesthesia Regional Block: Interscalene brachial plexus block   Pre-Anesthetic Checklist: ,, timeout performed, Correct Patient, Correct Site, Correct Laterality, Correct Procedure, Correct Position, site marked, Risks and benefits discussed,  Surgical consent,  Pre-op evaluation,  At surgeon's request and post-op pain management  Laterality: Right  Prep: chloraprep       Needles:  Injection technique: Single-shot  Needle Type: Echogenic Stimulator Needle     Needle Length: 9cm  Needle Gauge: 21     Additional Needles:   Procedures:,,,, ultrasound used (permanent image in chart),,,,  Narrative:  Start time: 05/08/2018 9:30 AM End time: 05/08/2018 9:36 AM Injection made incrementally with aspirations every 5 mL.  Performed by: Personally  Anesthesiologist: Lucretia Kern, MD  Additional Notes: Monitors applied. Injection made in 5cc increments. No resistance to injection. Good needle visualization. Patient tolerated procedure well.

## 2018-05-08 NOTE — Anesthesia Postprocedure Evaluation (Signed)
Anesthesia Post Note  Patient: John Ellison  Procedure(s) Performed: REVERSE SHOULDER ARTHROPLASTY (Right )     Patient location during evaluation: PACU Anesthesia Type: General Level of consciousness: awake and alert Pain management: pain level controlled Vital Signs Assessment: post-procedure vital signs reviewed and stable Respiratory status: spontaneous breathing, nonlabored ventilation and respiratory function stable Cardiovascular status: blood pressure returned to baseline and stable Postop Assessment: no apparent nausea or vomiting Anesthetic complications: no    Last Vitals:  Vitals:   05/08/18 1257 05/08/18 1300  BP: 134/67 129/74  Pulse: (!) 56 (!) 54  Resp: 15 16  Temp: 36.6 C   SpO2: 95% 99%    Last Pain:  Vitals:   05/08/18 1257  TempSrc:   PainSc: 0-No pain                 Lucretia Kern

## 2018-05-08 NOTE — Transfer of Care (Signed)
Immediate Anesthesia Transfer of Care Note  Patient: John Ellison  Procedure(s) Performed: REVERSE SHOULDER ARTHROPLASTY (Right )  Patient Location: PACU  Anesthesia Type:General and GA combined with regional for post-op pain  Level of Consciousness: drowsy and patient cooperative  Airway & Oxygen Therapy: Patient Spontanous Breathing  Post-op Assessment: Report given to RN, Post -op Vital signs reviewed and stable and Patient moving all extremities X 4  Post vital signs: Reviewed and stable  Last Vitals:  Vitals Value Taken Time  BP 139/77 05/08/2018 12:00 PM  Temp    Pulse 62 05/08/2018 12:02 PM  Resp 16 05/08/2018 12:02 PM  SpO2 96 % 05/08/2018 12:02 PM  Vitals shown include unvalidated device data.  Last Pain:  Vitals:   05/08/18 0917  TempSrc:   PainSc: 2       Patients Stated Pain Goal: 2 (05/08/18 3810)  Complications: No apparent anesthesia complications

## 2018-05-08 NOTE — Discharge Instructions (Signed)
Discharge Instructions after Reverse Total Shoulder Arthroplasty ° ° °A sling has been provided for you. You are to where this at all times, even while sleeping, until your first post operative visit with Dr. Amoni Morales. °Use ice on the shoulder intermittently over the first 48 hours after surgery.  °Pain medicine has been prescribed for you.  °Use your medicine liberally over the first 48 hours, and then you can begin to taper your use. You may take Extra Strength Tylenol or Tylenol only in place of the pain pills. DO NOT take ANY nonsteroidal anti-inflammatory pain medications: Advil, Motrin, Ibuprofen, Aleve, Naproxen or Naprosyn.  °Take one aspirin a day for 2 weeks after surgery, unless you have an aspirin sensitivity/allergy or asthma.  °Leave your dressing on until your first follow up visit.  You may shower with the dressing.  Hold your arm as if you still have your sling on while you shower. °Simply allow the water to wash over the site and then pat dry. Make sure your axilla (armpit) is completely dry after showering. ° ° ° °Please call [phone] during normal business hours or [phone] after hours for any problems. Including the following: ° °- excessive redness of the incisions °- drainage for more than 4 days °- fever of more than 101.5 F ° °*Please note that pain medications will not be refilled after hours or on weekends. ° ° ° ° °

## 2018-05-08 NOTE — Anesthesia Preprocedure Evaluation (Addendum)
Anesthesia Evaluation  Patient identified by MRN, date of birth, ID band Patient awake    Reviewed: Allergy & Precautions, NPO status , Patient's Chart, lab work & pertinent test results  History of Anesthesia Complications Negative for: history of anesthetic complications  Airway Mallampati: II  TM Distance: >3 FB Neck ROM: Full    Dental no notable dental hx. (+) Teeth Intact   Pulmonary neg pulmonary ROS, Current Smoker,    Pulmonary exam normal        Cardiovascular negative cardio ROS Normal cardiovascular exam     Neuro/Psych PSYCHIATRIC DISORDERS Anxiety negative neurological ROS     GI/Hepatic negative GI ROS, Neg liver ROS,   Endo/Other  negative endocrine ROS  Renal/GU Renal InsufficiencyRenal disease  negative genitourinary   Musculoskeletal negative musculoskeletal ROS (+)   Abdominal   Peds  Hematology negative hematology ROS (+)   Anesthesia Other Findings   Reproductive/Obstetrics                            Anesthesia Physical Anesthesia Plan  ASA: II  Anesthesia Plan: General   Post-op Pain Management: GA combined w/ Regional for post-op pain   Induction: Intravenous  PONV Risk Score and Plan: 1 and Ondansetron, Dexamethasone, Midazolam and Treatment may vary due to age or medical condition  Airway Management Planned: Oral ETT  Additional Equipment: None  Intra-op Plan:   Post-operative Plan: Extubation in OR  Informed Consent: I have reviewed the patients History and Physical, chart, labs and discussed the procedure including the risks, benefits and alternatives for the proposed anesthesia with the patient or authorized representative who has indicated his/her understanding and acceptance.   Dental advisory given  Plan Discussed with:   Anesthesia Plan Comments:        Anesthesia Quick Evaluation

## 2018-05-08 NOTE — H&P (Signed)
John Ellison is an 73 y.o. male.   Chief Complaint: R shoulder pain and dysfunction. HPI: Advanced right shoulder arthritis with irrepairable RCT with significant pain and dysfunction, failed conservative measures.  Pain interferes with sleep and quality of life.   Past Medical History:  Diagnosis Date  . Anxiety   . History of kidney stones     Past Surgical History:  Procedure Laterality Date  . APPENDECTOMY      History reviewed. No pertinent family history. Social History:  reports that he has been smoking cigarettes. He has a 7.50 pack-year smoking history. He has never used smokeless tobacco. He reports that he does not drink alcohol or use drugs.  Allergies: No Known Allergies  Medications Prior to Admission  Medication Sig Dispense Refill  . aspirin-sod bicarb-citric acid (ALKA-SELTZER) 325 MG TBEF tablet Take 650 mg by mouth daily as needed (congestion).    . clonazePAM (KLONOPIN) 2 MG tablet Take 2 mg by mouth 2 (two) times daily.    . methocarbamol (ROBAXIN) 500 MG tablet Take 500 mg by mouth 2 (two) times daily.    . traMADol (ULTRAM) 50 MG tablet Take 50 mg by mouth 2 (two) times daily.      No results found for this or any previous visit (from the past 48 hour(s)). No results found.  Review of Systems  All other systems reviewed and are negative.   Blood pressure (!) 147/71, pulse 63, temperature 97.9 F (36.6 C), temperature source Oral, resp. rate 18, height 5\' 11"  (1.803 m), weight 76.2 kg, SpO2 98 %. Physical Exam  Constitutional: He is oriented to person, place, and time. He appears well-developed and well-nourished.  HENT:  Head: Atraumatic.  Eyes: EOM are normal.  Cardiovascular: Intact distal pulses.  Respiratory: Effort normal.  Musculoskeletal:     Comments: R shoulder pain with limited ROM.  Neurological: He is alert and oriented to person, place, and time.  Skin: Skin is warm and dry.  Psychiatric: He has a normal mood and affect.      Assessment/Plan R shoulder rotator cuff tear arthropathy Plan R reverse TSA Risks / benefits of surgery discussed Consent on chart  NPO for OR Preop antibiotics   Berline Lopes, MD 05/08/2018, 9:13 AM

## 2018-05-09 ENCOUNTER — Encounter (HOSPITAL_COMMUNITY): Payer: Self-pay | Admitting: Orthopedic Surgery

## 2018-05-09 LAB — BASIC METABOLIC PANEL
Anion gap: 7 (ref 5–15)
BUN: 29 mg/dL — ABNORMAL HIGH (ref 8–23)
CO2: 23 mmol/L (ref 22–32)
Calcium: 8.8 mg/dL — ABNORMAL LOW (ref 8.9–10.3)
Chloride: 109 mmol/L (ref 98–111)
Creatinine, Ser: 1.56 mg/dL — ABNORMAL HIGH (ref 0.61–1.24)
GFR calc Af Amer: 51 mL/min — ABNORMAL LOW (ref >60)
GFR calc non Af Amer: 44 mL/min — ABNORMAL LOW (ref >60)
Glucose, Bld: 132 mg/dL — ABNORMAL HIGH (ref 70–99)
Potassium: 4.1 mmol/L (ref 3.5–5.1)
Sodium: 139 mmol/L (ref 135–145)

## 2018-05-09 LAB — CBC
HCT: 45.1 % (ref 39.0–52.0)
Hemoglobin: 14.9 g/dL (ref 13.0–17.0)
MCH: 31.2 pg (ref 26.0–34.0)
MCHC: 33 g/dL (ref 30.0–36.0)
MCV: 94.4 fL (ref 80.0–100.0)
Platelets: 155 10*3/uL (ref 150–400)
RBC: 4.78 MIL/uL (ref 4.22–5.81)
RDW: 12.6 % (ref 11.5–15.5)
WBC: 16 10*3/uL — ABNORMAL HIGH (ref 4.0–10.5)
nRBC: 0 % (ref 0.0–0.2)

## 2018-05-09 NOTE — Discharge Summary (Signed)
Patient ID: John Ellison MRN: [id number] DOB/AGE: [birth year] 73 y.o.  Admit date: 05/08/2018 Discharge date: 05/09/2018  Admission Diagnoses:  Active Problems:   S/P reverse total shoulder arthroplasty, right   Discharge Diagnoses:  Same  Past Medical History:  Diagnosis Date  . Anxiety   . History of kidney stones     Surgeries: Procedure(s): REVERSE SHOULDER ARTHROPLASTY on 05/08/2018   Consultants:   Discharged Condition: Improved  Hospital Course: John Ellison is an 73 y.o. male who was admitted 05/08/2018 for operative treatment of Right shoulder rotator cuff tear arthropathy. Patient has severe unremitting pain that affects sleep, daily activities, and work/hobbies. After pre-op clearance the patient was taken to the operating room on 05/08/2018 and underwent  Procedure(s): REVERSE SHOULDER ARTHROPLASTY.    Patient was given perioperative antibiotics:  Anti-infectives (From admission, onward)   Start     Dose/Rate Route Frequency Ordered Stop   05/08/18 1600  ceFAZolin (ANCEF) IVPB 1 g/50 mL premix     1 g 100 mL/hr over 30 Minutes Intravenous Every 6 hours 05/08/18 1303 05/09/18 0714   05/08/18 1045  ceFAZolin (ANCEF) IVPB 2g/100 mL premix     2 g 200 mL/hr over 30 Minutes Intravenous On call to O.R. 05/08/18 3818 05/08/18 1018       Patient was given sequential compression devices, early ambulation, and chemoprophylaxis to prevent DVT.  Patient benefited maximally from hospital stay and there were no complications.    Recent vital signs:  Patient Vitals for the past 24 hrs:  BP Temp Temp src Pulse Resp SpO2 Height Weight  05/09/18 0410 124/74 98.1 F (36.7 C) Oral 66 20 97 % - -  05/09/18 0021 128/72 98.6 F (37 C) Oral 72 16 100 % - -  05/08/18 2054 124/77 98.5 F (36.9 C) Oral 66 14 96 % - -  05/08/18 1300 129/74 - - (!) 54 16 99 % - -  05/08/18 1257 134/67 97.9 F (36.6 C) - (!) 56 15 95 % - -  05/08/18 1230 135/74 - - (!) 54 10 95 % - -  05/08/18  1215 (!) 139/93 - - (!) 56 13 95 % - -  05/08/18 1200 139/77 98 F (36.7 C) - 68 16 96 % - -  05/08/18 0945 (!) 148/65 - - (!) 55 13 98 % - -  05/08/18 0940 135/62 - - (!) 53 15 97 % - -  05/08/18 0906 (!) 147/71 97.9 F (36.6 C) Oral 63 18 98 % 5\' 11"  (1.803 m) 76.2 kg  05/08/18 0905 - - - - - - 5\' 11"  (1.803 m) 76.4 kg     Recent laboratory studies:  Recent Labs    05/09/18 0251  WBC 16.0*  HGB 14.9  HCT 45.1  PLT 155  NA 139  K 4.1  CL 109  CO2 23  BUN 29*  CREATININE 1.56*  GLUCOSE 132*  CALCIUM 8.8*     Discharge Medications:   Allergies as of 05/09/2018   No Known Allergies     Medication List    TAKE these medications   aspirin 325 MG EC tablet Take 1 tablet (325 mg total) by mouth daily.   aspirin-sod bicarb-citric acid 325 MG Tbef tablet Commonly known as:  ALKA-SELTZER Take 650 mg by mouth daily as needed (congestion).   clonazePAM 2 MG tablet Commonly known as:  KLONOPIN Take 2 mg by mouth 2 (two) times daily.   methocarbamol 500 MG tablet Commonly known as:  ROBAXIN Take 500 mg by mouth 2 (two) times daily.   oxyCODONE-acetaminophen 5-325 MG tablet Commonly known as:  PERCOCET Take 1 tablet by mouth every 6 (six) hours as needed for severe pain.   traMADol 50 MG tablet Commonly known as:  ULTRAM Take 50 mg by mouth 2 (two) times daily.       Diagnostic Studies: Dg Chest 2 View  Result Date: 04/28/2018 CLINICAL DATA:  Shoulder surgery. EXAM: CHEST - 2 VIEW COMPARISON:  07/15/2009. FINDINGS: Mediastinum and hilar structures normal. Heart size normal. Mild bibasilar atelectasis. Small left pleural effusion. No pneumothorax. IMPRESSION: 1.  Mild bibasilar atelectasis. 2.  Small left pleural effusion versus pleural scarring. Electronically Signed   By: Maisie Fus  Register   On: 04/28/2018 09:15   Dg Shoulder Right Port  Result Date: 05/08/2018 CLINICAL DATA:  Shoulder arthroplasty. EXAM: PORTABLE RIGHT SHOULDER COMPARISON:  Chest x-ray  04/28/2018, 07/15/2009. FINDINGS: Total right shoulder replacement. Hardware intact. Anatomic alignment. Degenerative changes acromioclavicular joint. Prior cervical spine fusion. Right apical pleural thickening consistent scarring again noted. IMPRESSION: Total right shoulder replacement with anatomic alignment. Electronically Signed   By: Maisie Fus  Register   On: 05/08/2018 12:45    Disposition: Discharge disposition: 01-Home or Self Care       Discharge Instructions    Call MD / Call 911   Complete by:  As directed    If you experience chest pain or shortness of breath, CALL 911 and be transported to the hospital emergency room.  If you develope a fever above 101 F, pus (white drainage) or increased drainage or redness at the wound, or calf pain, call your surgeon's office.   Constipation Prevention   Complete by:  As directed    Drink plenty of fluids.  Prune juice may be helpful.  You may use a stool softener, such as Colace (over the counter) 100 mg twice a day.  Use MiraLax (over the counter) for constipation as needed.   Diet - low sodium heart healthy   Complete by:  As directed    Driving restrictions   Complete by:  As directed    No driving for 6 weeks   Increase activity slowly as tolerated   Complete by:  As directed       Follow-up Information    Jones Broom, MD In 2 weeks.   Specialty:  Orthopedic Surgery Contact information: [address] [place] [address] [phone]            Signed: Berline Lopes 05/09/2018, 8:05 AM

## 2018-05-09 NOTE — Progress Notes (Addendum)
1045 Pt was ambulating in the room with sready gait. Right arm sling on. Right shoulder dressing dry and intact. Discharge instructions given to pt, verbalized understanding.  Discharged to home accompanied by son.

## 2018-05-09 NOTE — Evaluation (Signed)
Occupational Therapy Evaluation and Discharge Patient Details Name: John ChancellorRonald J Ellison MRN: [id number] DOB: 12-Jan-1946 Today's Date: 05/09/2018    History of Present Illness Pt is a 73 y/o S/P reverse total shoulder arthroplasty, right. Pt has a PMH of Anxiety and History of kidney stones.    Clinical Impression   PTA Pt was mod I for ADL and mobility - has been dealing with R shoulder problems for past year and a half. Educated on conservative shoulder protocol (d/c shoulder handout provided and reviewed in full): Educated patient on don/doff sling with return demonstration, sling management, sleeping positioning (recommend recliner if possible), safety and compensatory strategies for bathing, dressing sequencing and smart clothing choices, non-weight bearing and no shoulder ROM, Home exercise program as stated below for elbow, wrist, and hand (as indicated by MD). Pt's son and daughter in law present for all education, Pt dressed and sling properly positioned at end of session. Education complete. Pt and family had no further questions for OT. Thank you for the opportunity to serve this patient.     Follow Up Recommendations  Follow surgeon's recommendation for DC plan and follow-up therapies    Equipment Recommendations  None recommended by OT    Recommendations for Other Services       Precautions / Restrictions Precautions Precautions: Shoulder Type of Shoulder Precautions: conservative Shoulder Interventions: Shoulder sling/immobilizer;At all times;Off for dressing/bathing/exercises Precaution Booklet Issued: Yes (comment) Precaution Comments: OT shoulder handout reviewed in full Required Braces or Orthoses: Sling Restrictions Weight Bearing Restrictions: Yes RUE Weight Bearing: Non weight bearing      Mobility Bed Mobility Overal bed mobility: Modified Independent             General bed mobility comments: increased time, no attempts to push/pull with  RUE  Transfers Overall transfer level: Modified independent Equipment used: None                  Balance Overall balance assessment: No apparent balance deficits (not formally assessed)                                         ADL either performed or assessed with clinical judgement   ADL                                         General ADL Comments: see shoulder section below     Vision Patient Visual Report: No change from baseline       Perception     Praxis      Pertinent Vitals/Pain Pain Assessment: 0-10 Pain Score: 1  Pain Location: R shoulder Pain Descriptors / Indicators: Dull Pain Intervention(s): Limited activity within patient's tolerance;Monitored during session;Repositioned     Hand Dominance Right(has learned to do many things with left hand)   Extremity/Trunk Assessment Upper Extremity Assessment Upper Extremity Assessment: RUE deficits/detail RUE Deficits / Details: Block still largely in place, post-op deficits as anticipated RUE Sensation: decreased light touch RUE Coordination: decreased fine motor;decreased gross motor   Lower Extremity Assessment Lower Extremity Assessment: Overall WFL for tasks assessed   Cervical / Trunk Assessment Cervical / Trunk Assessment: Normal;Other exceptions Cervical / Trunk Exceptions: history of cervical sx   Communication Communication Communication: No difficulties   Cognition Arousal/Alertness: Awake/alert Behavior During Therapy: WFL for tasks  assessed/performed Overall Cognitive Status: Within Functional Limits for tasks assessed                                     General Comments  son and DIL present throughout session    Exercises Exercises: Shoulder Shoulder Exercises Elbow Flexion: AROM;Right;10 reps Elbow Extension: AROM;Right;10 reps Wrist Flexion: AROM;Right Wrist Extension: AROM;Right Digit Composite Flexion: AROM;Right Composite  Extension: AROM;Right Neck Flexion: AROM Neck Extension: AROM Neck Lateral Flexion - Right: AROM Neck Lateral Flexion - Left: AROM   Shoulder Instructions Shoulder Instructions Donning/doffing shirt without moving shoulder: Maximal assistance Method for sponge bathing under operated UE: Supervision/safety Donning/doffing sling/immobilizer: Moderate assistance Correct positioning of sling/immobilizer: Supervision/safety ROM for elbow, wrist and digits of operated UE: Supervision/safety Sling wearing schedule (on at all times/off for ADL's): Independent Proper positioning of operated UE when showering: Supervision/safety Positioning of UE while sleeping: Minimal assistance    Home Living Family/patient expects to be discharged to:: Private residence Living Arrangements: Spouse/significant other Available Help at Discharge: Family;Available 24 hours/day(wife with limited ability to assist - son/DIL close) Type of Home: House Home Access: Stairs to enter Enter[employer] CorporationEntrance Stairs-Number of Steps: 2   Home Layout: One level     Bathroom Shower/Tub: Chief Strategy OfficerTub/shower unit   Bathroom Toilet: Standard     Home Equipment: None          Prior Functioning/Environment Level of Independence: Independent        Comments: has been dealing with injury for over a year and a half        OT Problem List: Decreased range of motion;Decreased activity tolerance;Decreased knowledge of precautions;Impaired sensation;Impaired UE functional use;Pain      OT Treatment/Interventions:      OT Goals(Current goals can be found in the care plan section) Acute Rehab OT Goals Patient Stated Goal: get back to using my arm again OT Goal Formulation: With patient Time For Goal Achievement: 05/23/18 Potential to Achieve Goals: Good  OT Frequency:     Barriers to D/C:            Co-evaluation              AM-PAC OT "6 Clicks" Daily Activity     Outcome Measure Help from another person eating meals?:  A Little Help from another person taking care of personal grooming?: A Little Help from another person toileting, which includes using toliet, bedpan, or urinal?: A Little Help from another person bathing (including washing, rinsing, drying)?: A Little Help from another person to put on and taking off regular upper body clothing?: A Lot Help from another person to put on and taking off regular lower body clothing?: A Little 6 Click Score: 17   End of Session Equipment Utilized During Treatment: Other (comment)(sling) Nurse Communication: Mobility status;Weight bearing status;Precautions  Activity Tolerance: Patient tolerated treatment well Patient left: in chair;with call bell/phone within reach;with family/visitor present  OT Visit Diagnosis: Pain Pain - Right/Left: Right Pain - part of body: Shoulder                Time: [id number] OT Time Calculation (min): 38 min Charges:  OT General Charges $OT Visit: 1 Visit OT Evaluation $OT Eval Moderate Complexity: 1 Mod OT Treatments $Self Care/Home Management : 8-22 mins $Therapeutic Exercise: 8-22 mins  Sherryl MangesLaura Ryland Smoots OTR/L Acute Rehabilitation Services Pager: [phone] Office: [phone]  Evern BioLaura J Susane Bey 05/09/2018, 10:48 AM

## 2018-05-09 NOTE — Progress Notes (Signed)
   PATIENT ID: John Ellison   1 Day Post-Op Procedure(s) (LRB): REVERSE SHOULDER ARTHROPLASTY (Right)  Subjective: The patient is doing well this morning.  He has no pain.  He is ready to go home.  Objective:  Vitals:   05/09/18 0021 05/09/18 0410  BP: 128/72 124/74  Pulse: 72 66  Resp: 16 20  Temp: 98.6 F (37 C) 98.1 F (36.7 C)  SpO2: 100% 97%     Right shoulder dressing clean dry and intact.  Distally neurovascularly intact.  His sling is intact.  Labs:  Recent Labs    05/09/18 0251  HGB 14.9   Recent Labs    05/09/18 0251  WBC 16.0*  RBC 4.78  HCT 45.1  PLT 155   Recent Labs    05/09/18 0251  NA 139  K 4.1  CL 109  CO2 23  BUN 29*  CREATININE 1.56*  GLUCOSE 132*  CALCIUM 8.8*    Assessment and Plan:Postop day 1 doing well.  Ready to go home.  VTE proph: Aspirin

## 2020-02-26 IMAGING — CR DG CHEST 2V
2 series · 2 of 2 positions shown · non-contrast
Comparison: 07/15/2009.

CLINICAL DATA: Shoulder surgery.

EXAM:
CHEST - 2 VIEW

[w chest pa]
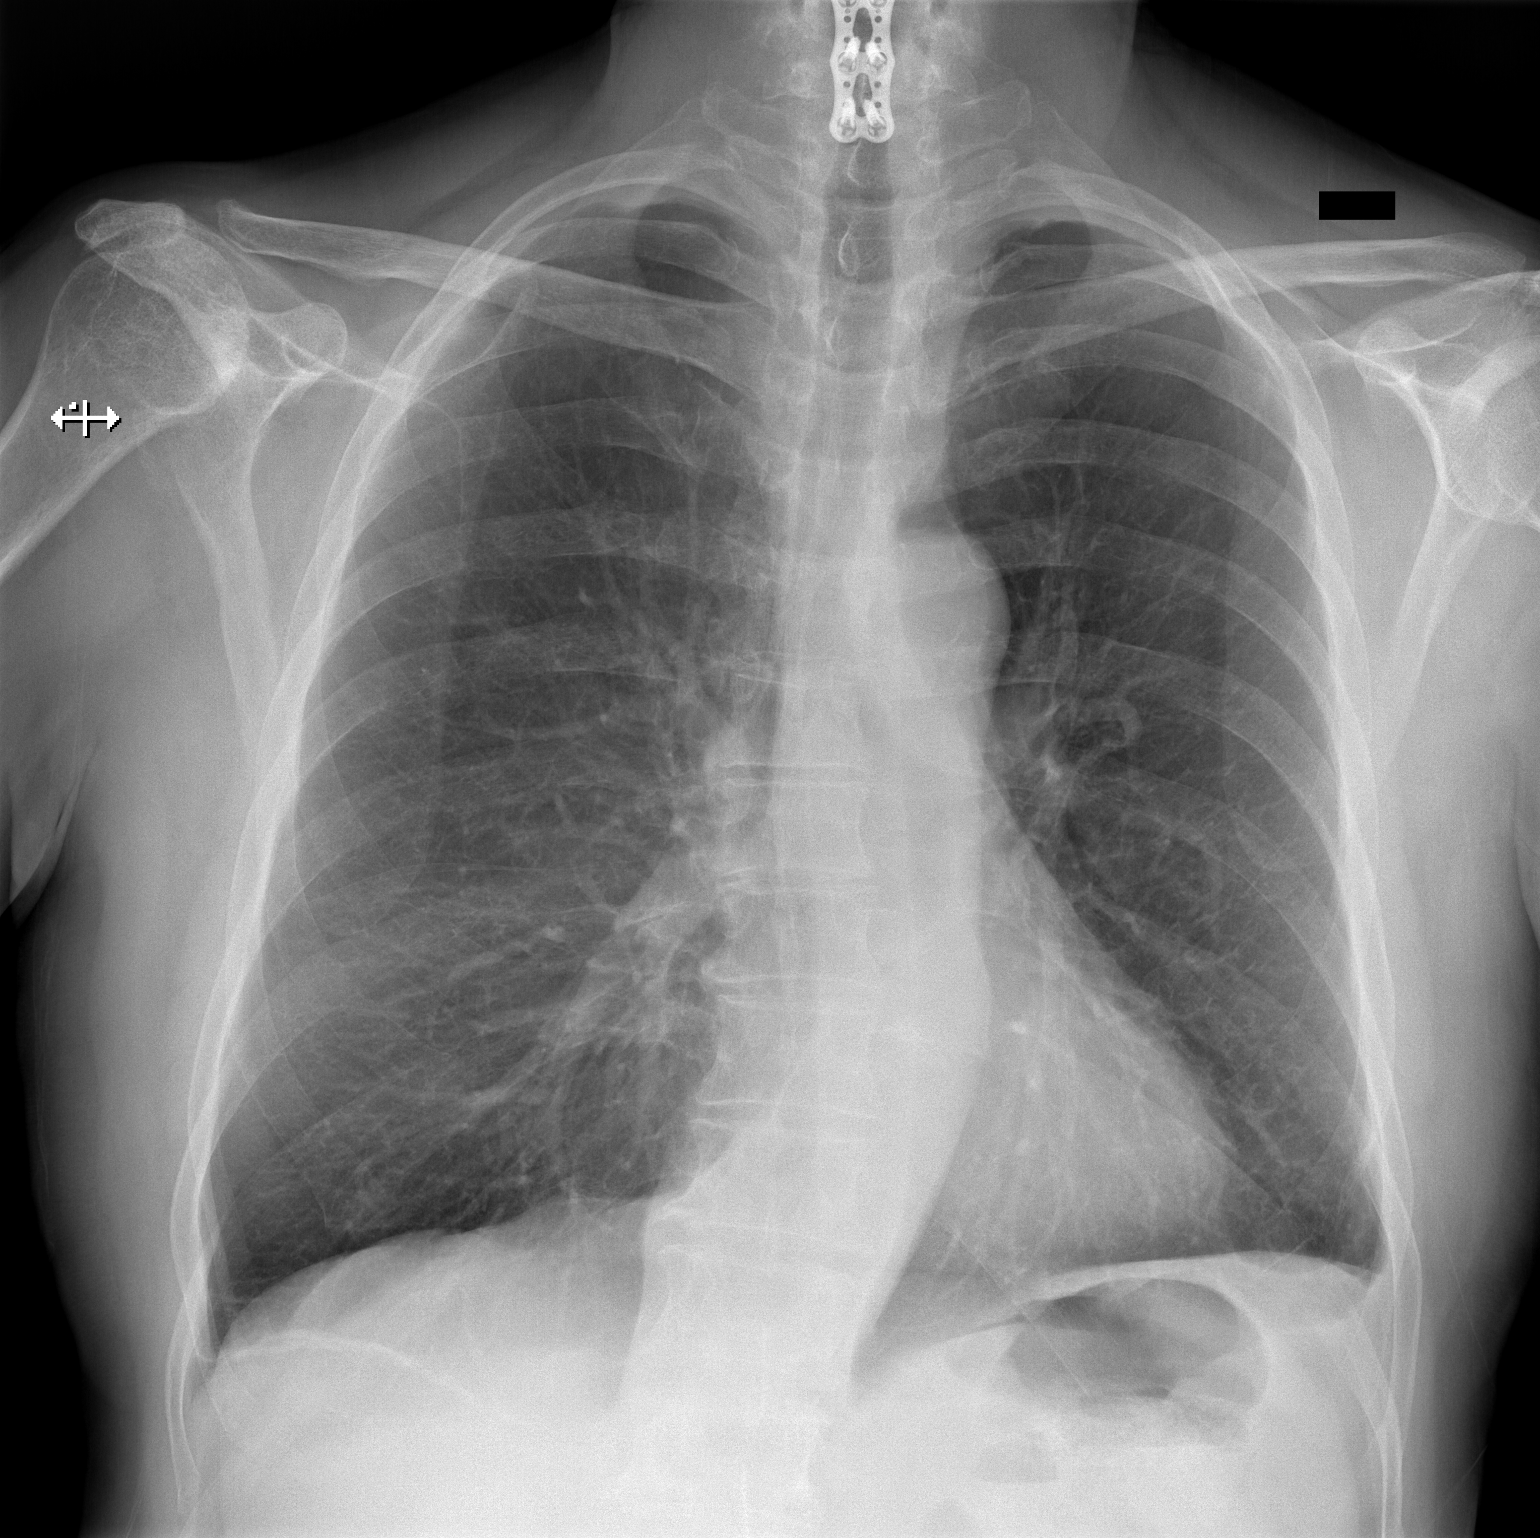

[w chest lat]
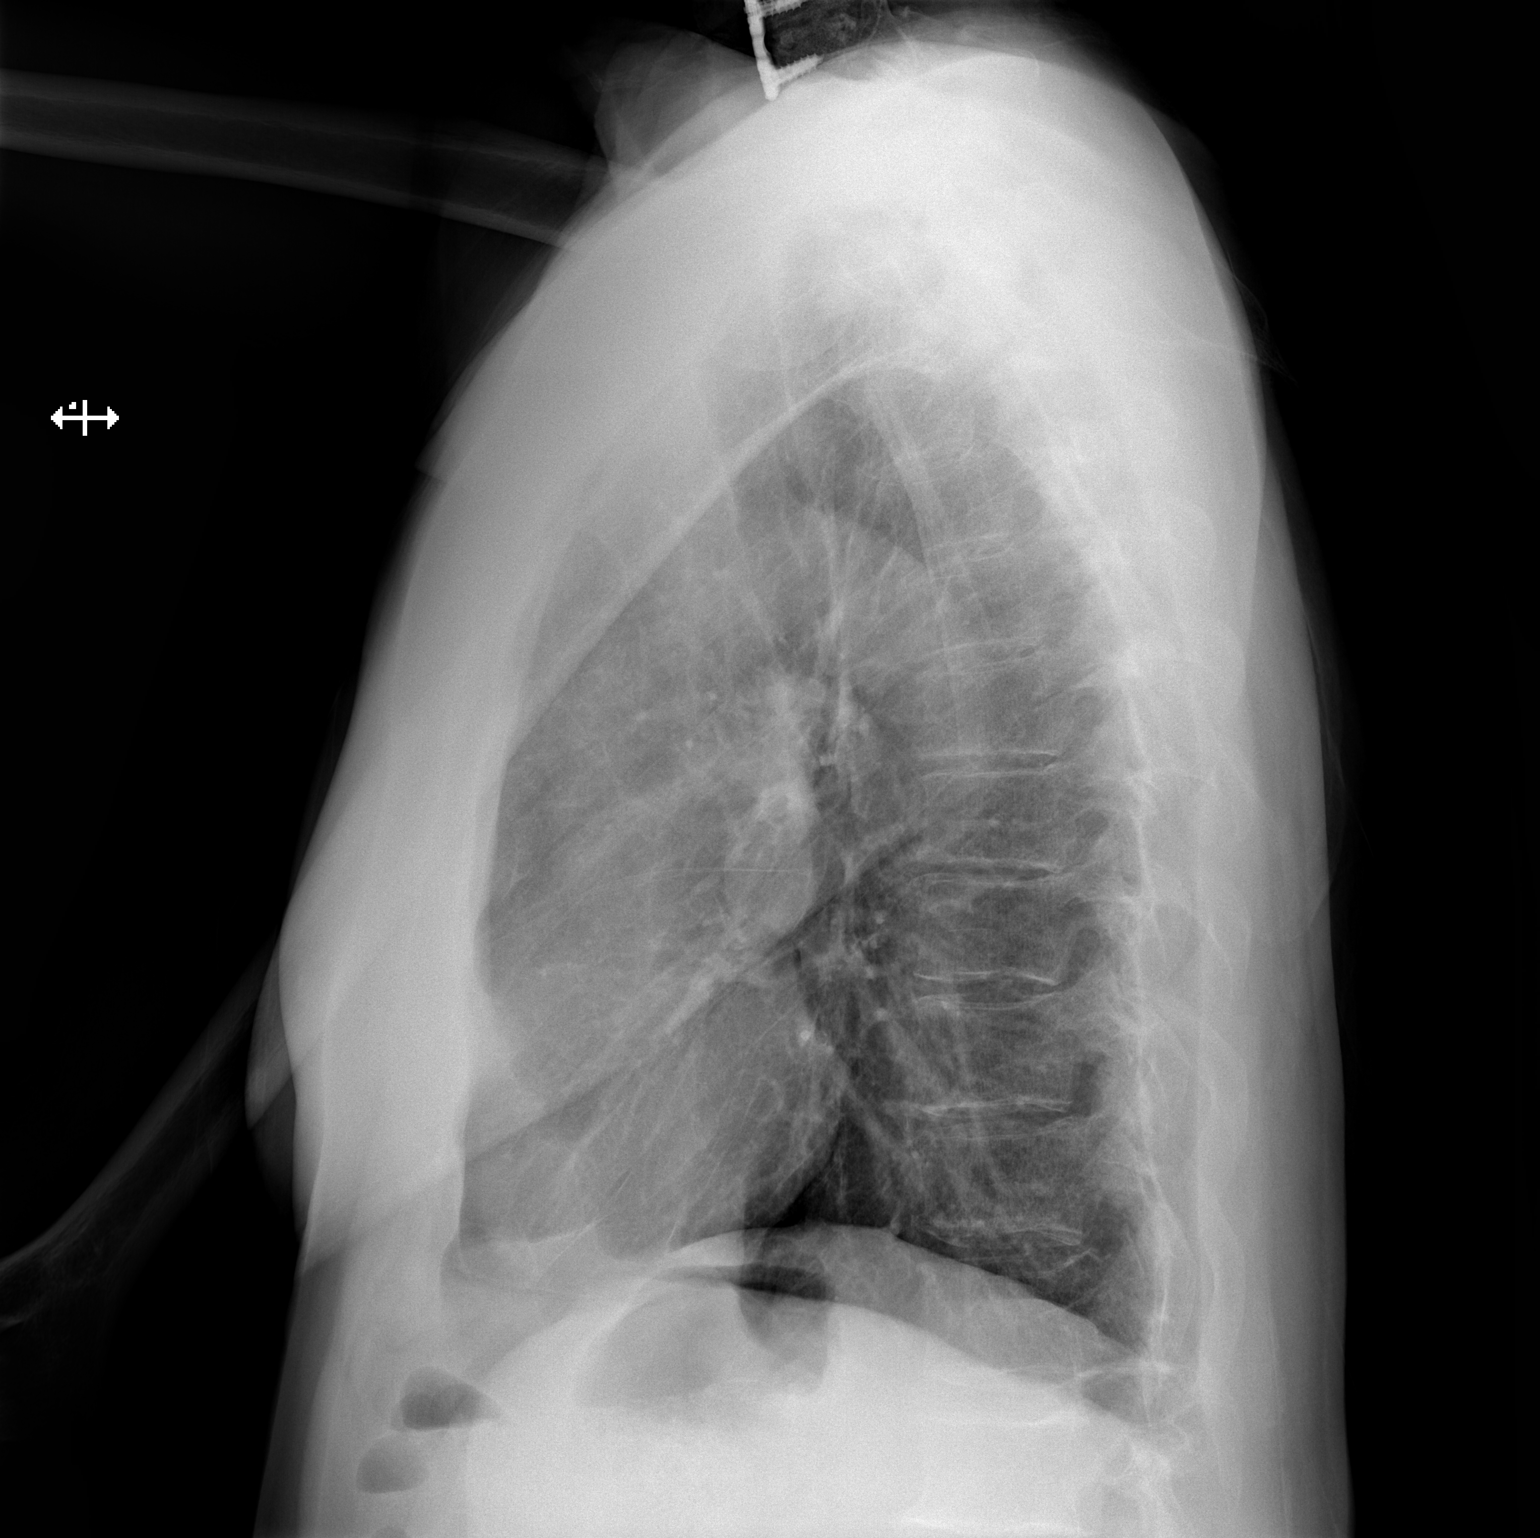

[2 of 2 positions shown; findings below may reference images not displayed]

FINDINGS: Mediastinum and hilar structures normal. Heart size normal. Mild
bibasilar atelectasis. Small left pleural effusion. No pneumothorax.
IMPRESSION: 1.  Mild bibasilar atelectasis.

2.  Small left pleural effusion versus pleural scarring.

## 2020-03-07 IMAGING — DX DG SHOULDER 2+V PORT*R*
1 series · 1 of 1 positions shown · non-contrast
Comparison: Chest x-ray 04/28/2018, 07/15/2009.

CLINICAL DATA: Shoulder arthroplasty.

EXAM:
PORTABLE RIGHT SHOULDER

[shoulder]
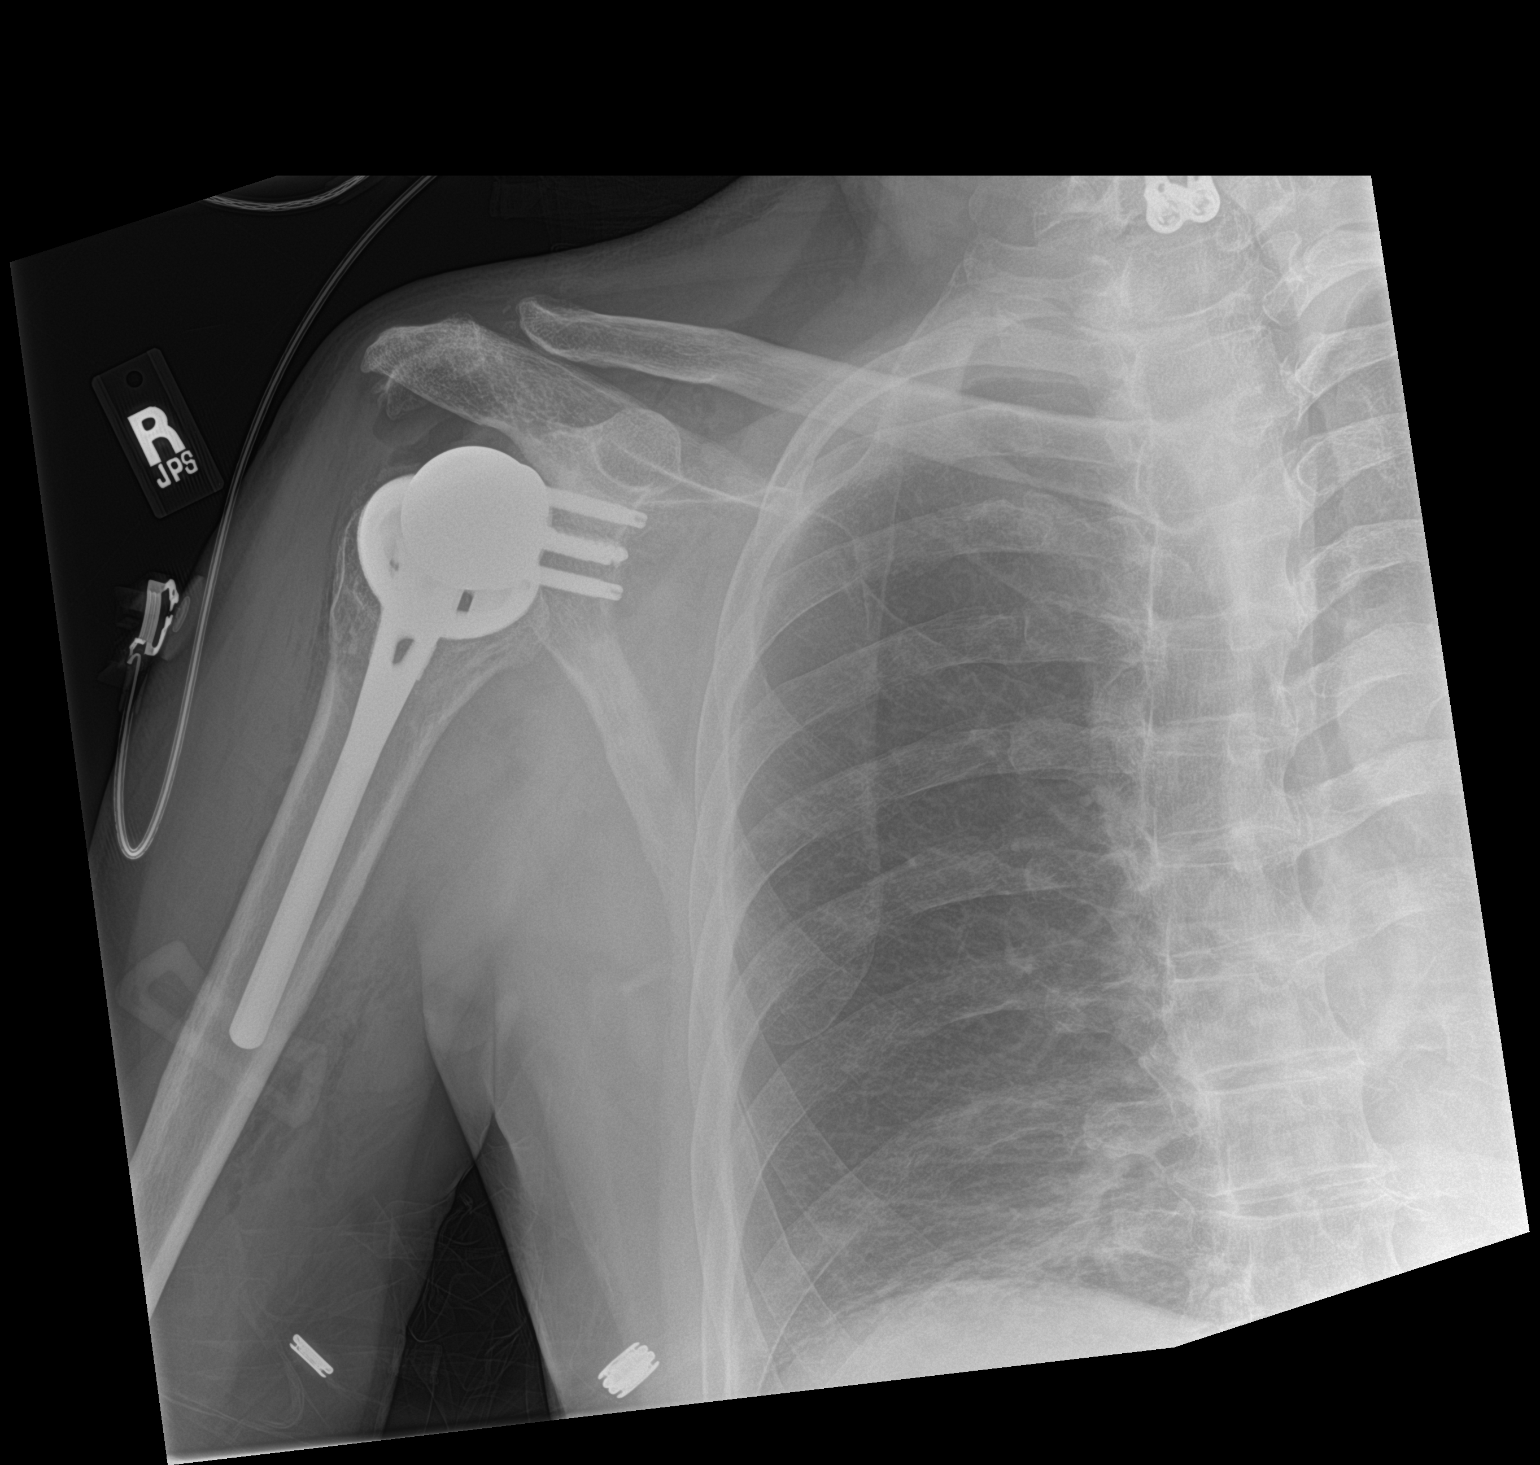

[1 of 1 positions shown; findings below may reference images not displayed]

FINDINGS: Total right shoulder replacement. Hardware intact. Anatomic
alignment. Degenerative changes acromioclavicular joint. Prior
cervical spine fusion. Right apical pleural thickening consistent
scarring again noted.
IMPRESSION: Total right shoulder replacement with anatomic alignment.
